# Patient Record
Sex: Female | Born: 1965 | Race: White | Hispanic: No | Marital: Married | Smoking: Never smoker
Health system: Southern US, Community
[De-identification: ages and names within clinical notes are randomized; demographics above are authoritative.]

## PROBLEM LIST (undated history)

## (undated) DIAGNOSIS — C50919 Malignant neoplasm of unspecified site of unspecified female breast: Secondary | ICD-10-CM

## (undated) DIAGNOSIS — E785 Hyperlipidemia, unspecified: Secondary | ICD-10-CM

## (undated) DIAGNOSIS — Z923 Personal history of irradiation: Secondary | ICD-10-CM

## (undated) HISTORY — PX: BREAST EXCISIONAL BIOPSY: SUR124

## (undated) HISTORY — PX: FRONTAL SINUS OBLITERATION: SHX1685

---

## 2005-05-17 ENCOUNTER — Ambulatory Visit: Payer: Self-pay | Admitting: Family Medicine

## 2005-05-28 ENCOUNTER — Ambulatory Visit: Payer: Self-pay | Admitting: Family Medicine

## 2005-06-19 ENCOUNTER — Ambulatory Visit: Payer: Self-pay | Admitting: Surgery

## 2005-07-09 ENCOUNTER — Ambulatory Visit: Payer: Self-pay | Admitting: Surgery

## 2005-08-06 ENCOUNTER — Ambulatory Visit: Payer: Self-pay | Admitting: Oncology

## 2005-08-24 ENCOUNTER — Ambulatory Visit: Payer: Self-pay | Admitting: Oncology

## 2005-09-23 ENCOUNTER — Ambulatory Visit: Payer: Self-pay | Admitting: Oncology

## 2005-10-24 ENCOUNTER — Ambulatory Visit: Payer: Self-pay | Admitting: Oncology

## 2005-12-24 DIAGNOSIS — C50919 Malignant neoplasm of unspecified site of unspecified female breast: Secondary | ICD-10-CM

## 2005-12-24 HISTORY — PX: BREAST LUMPECTOMY: SHX2

## 2005-12-24 HISTORY — DX: Malignant neoplasm of unspecified site of unspecified female breast: C50.919

## 2006-02-04 ENCOUNTER — Ambulatory Visit: Payer: Self-pay | Admitting: Oncology

## 2006-03-13 ENCOUNTER — Ambulatory Visit: Payer: Self-pay | Admitting: Oncology

## 2006-08-02 ENCOUNTER — Ambulatory Visit: Payer: Self-pay | Admitting: Oncology

## 2007-01-29 ENCOUNTER — Ambulatory Visit: Payer: Self-pay | Admitting: Oncology

## 2007-03-10 ENCOUNTER — Ambulatory Visit: Payer: Self-pay | Admitting: Oncology

## 2007-07-16 ENCOUNTER — Ambulatory Visit: Payer: Self-pay | Admitting: Otolaryngology

## 2007-08-05 ENCOUNTER — Ambulatory Visit: Payer: Self-pay | Admitting: Oncology

## 2007-08-25 ENCOUNTER — Ambulatory Visit: Payer: Self-pay | Admitting: Oncology

## 2008-01-25 ENCOUNTER — Ambulatory Visit: Payer: Self-pay | Admitting: Oncology

## 2008-02-22 ENCOUNTER — Ambulatory Visit: Payer: Self-pay | Admitting: Oncology

## 2008-03-10 ENCOUNTER — Ambulatory Visit: Payer: Self-pay | Admitting: Oncology

## 2008-08-10 ENCOUNTER — Ambulatory Visit: Payer: Self-pay | Admitting: Oncology

## 2008-08-24 ENCOUNTER — Ambulatory Visit: Payer: Self-pay | Admitting: Oncology

## 2008-11-07 ENCOUNTER — Ambulatory Visit: Payer: Self-pay | Admitting: Family Medicine

## 2008-11-12 ENCOUNTER — Inpatient Hospital Stay: Payer: Self-pay | Admitting: Internal Medicine

## 2008-12-07 ENCOUNTER — Ambulatory Visit: Payer: Self-pay | Admitting: Internal Medicine

## 2009-01-24 ENCOUNTER — Ambulatory Visit: Payer: Self-pay | Admitting: Oncology

## 2009-01-25 ENCOUNTER — Ambulatory Visit: Payer: Self-pay | Admitting: Oncology

## 2009-02-21 ENCOUNTER — Ambulatory Visit: Payer: Self-pay | Admitting: Oncology

## 2009-03-07 ENCOUNTER — Ambulatory Visit: Payer: Self-pay | Admitting: Internal Medicine

## 2009-03-11 ENCOUNTER — Ambulatory Visit: Payer: Self-pay | Admitting: Oncology

## 2009-08-24 ENCOUNTER — Ambulatory Visit: Payer: Self-pay | Admitting: Oncology

## 2009-09-20 ENCOUNTER — Ambulatory Visit: Payer: Self-pay | Admitting: Oncology

## 2009-09-23 ENCOUNTER — Ambulatory Visit: Payer: Self-pay | Admitting: Oncology

## 2010-02-21 ENCOUNTER — Ambulatory Visit: Payer: Self-pay | Admitting: Oncology

## 2010-03-07 ENCOUNTER — Ambulatory Visit: Payer: Self-pay | Admitting: Oncology

## 2010-03-24 ENCOUNTER — Ambulatory Visit: Payer: Self-pay | Admitting: Oncology

## 2010-06-06 ENCOUNTER — Ambulatory Visit: Payer: Self-pay | Admitting: Gastroenterology

## 2010-08-24 ENCOUNTER — Ambulatory Visit: Payer: Self-pay | Admitting: Oncology

## 2010-08-29 ENCOUNTER — Ambulatory Visit: Payer: Self-pay | Admitting: Oncology

## 2010-09-23 ENCOUNTER — Ambulatory Visit: Payer: Self-pay | Admitting: Oncology

## 2011-03-22 ENCOUNTER — Ambulatory Visit: Payer: Self-pay | Admitting: Oncology

## 2011-09-03 ENCOUNTER — Ambulatory Visit: Payer: Self-pay | Admitting: Oncology

## 2014-12-16 ENCOUNTER — Ambulatory Visit: Payer: Self-pay | Admitting: Emergency Medicine

## 2015-01-06 ENCOUNTER — Ambulatory Visit: Payer: Self-pay | Admitting: Family Medicine

## 2016-01-30 ENCOUNTER — Encounter: Payer: Self-pay | Admitting: Emergency Medicine

## 2016-01-30 ENCOUNTER — Emergency Department
Admission: EM | Admit: 2016-01-30 | Discharge: 2016-01-30 | Disposition: A | Discharge status: Home / Self Care | Payer: Managed Care, Other (non HMO) | Attending: Emergency Medicine | Admitting: Emergency Medicine

## 2016-01-30 DIAGNOSIS — R110 Nausea: Secondary | ICD-10-CM | POA: Diagnosis not present

## 2016-01-30 DIAGNOSIS — R42 Dizziness and giddiness: Secondary | ICD-10-CM | POA: Diagnosis present

## 2016-01-30 HISTORY — DX: Hyperlipidemia, unspecified: E78.5

## 2016-01-30 LAB — GLUCOSE, CAPILLARY: Glucose-Capillary: 100 mg/dL — ABNORMAL HIGH (ref 65–99)

## 2016-01-30 NOTE — ED Notes (Signed)
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  Pt denies hx of DM (gestational only) or hx of HTN.  Pt reports meds for high cholesterol.    Pt reports nausea, denies SOB/V/D/HA.

## 2016-07-27 ENCOUNTER — Other Ambulatory Visit: Payer: Self-pay | Admitting: Family Medicine

## 2016-07-27 DIAGNOSIS — Z1231 Encounter for screening mammogram for malignant neoplasm of breast: Secondary | ICD-10-CM

## 2018-11-17 ENCOUNTER — Other Ambulatory Visit: Payer: Self-pay | Admitting: Nurse Practitioner

## 2018-11-17 DIAGNOSIS — Z1231 Encounter for screening mammogram for malignant neoplasm of breast: Secondary | ICD-10-CM

## 2018-12-29 ENCOUNTER — Ambulatory Visit: Payer: Managed Care, Other (non HMO)

## 2019-01-07 ENCOUNTER — Ambulatory Visit
Admission: RE | Admit: 2019-01-07 | Discharge: 2019-01-07 | Disposition: A | Discharge status: Home / Self Care | Payer: BC Managed Care – PPO | Source: Ambulatory Visit | Attending: Nurse Practitioner | Admitting: Nurse Practitioner

## 2019-01-07 ENCOUNTER — Encounter: Payer: Self-pay | Admitting: Radiology

## 2019-01-07 DIAGNOSIS — Z1231 Encounter for screening mammogram for malignant neoplasm of breast: Secondary | ICD-10-CM | POA: Diagnosis not present

## 2019-01-07 HISTORY — DX: Personal history of irradiation: Z92.3

## 2019-01-07 HISTORY — DX: Malignant neoplasm of unspecified site of unspecified female breast: C50.919

## 2019-12-04 ENCOUNTER — Other Ambulatory Visit: Payer: Self-pay | Admitting: Nurse Practitioner

## 2019-12-04 DIAGNOSIS — Z1231 Encounter for screening mammogram for malignant neoplasm of breast: Secondary | ICD-10-CM

## 2020-01-12 ENCOUNTER — Ambulatory Visit
Admission: RE | Admit: 2020-01-12 | Discharge: 2020-01-12 | Disposition: A | Discharge status: Home / Self Care | Payer: BC Managed Care – PPO | Source: Ambulatory Visit | Attending: Nurse Practitioner | Admitting: Nurse Practitioner

## 2020-01-12 ENCOUNTER — Other Ambulatory Visit: Payer: Self-pay

## 2020-01-12 DIAGNOSIS — Z1231 Encounter for screening mammogram for malignant neoplasm of breast: Secondary | ICD-10-CM | POA: Insufficient documentation

## 2020-12-15 ENCOUNTER — Other Ambulatory Visit: Payer: Self-pay | Admitting: Nurse Practitioner

## 2020-12-15 DIAGNOSIS — Z1231 Encounter for screening mammogram for malignant neoplasm of breast: Secondary | ICD-10-CM

## 2021-01-12 ENCOUNTER — Other Ambulatory Visit: Payer: Self-pay

## 2021-01-12 ENCOUNTER — Ambulatory Visit
Admission: RE | Admit: 2021-01-12 | Discharge: 2021-01-12 | Disposition: A | Discharge status: Home / Self Care | Payer: BC Managed Care – PPO | Source: Ambulatory Visit | Attending: Nurse Practitioner | Admitting: Nurse Practitioner

## 2021-01-12 DIAGNOSIS — Z1231 Encounter for screening mammogram for malignant neoplasm of breast: Secondary | ICD-10-CM | POA: Insufficient documentation

## 2021-12-08 ENCOUNTER — Other Ambulatory Visit: Payer: Self-pay | Admitting: Nurse Practitioner

## 2021-12-08 DIAGNOSIS — Z1231 Encounter for screening mammogram for malignant neoplasm of breast: Secondary | ICD-10-CM

## 2022-01-04 ENCOUNTER — Other Ambulatory Visit: Payer: Self-pay

## 2022-01-04 ENCOUNTER — Ambulatory Visit
Admission: EM | Admit: 2022-01-04 | Discharge: 2022-01-04 | Disposition: A | Discharge status: Home / Self Care | Payer: BC Managed Care – PPO | Attending: Nurse Practitioner | Admitting: Nurse Practitioner

## 2022-01-04 DIAGNOSIS — B349 Viral infection, unspecified: Secondary | ICD-10-CM | POA: Diagnosis present

## 2022-01-04 DIAGNOSIS — Z1152 Encounter for screening for COVID-19: Secondary | ICD-10-CM | POA: Diagnosis present

## 2022-01-04 DIAGNOSIS — R051 Acute cough: Secondary | ICD-10-CM | POA: Diagnosis present

## 2022-01-04 LAB — RESP PANEL BY RT-PCR (FLU A&B, COVID) ARPGX2
Influenza A by PCR: NEGATIVE
Influenza B by PCR: NEGATIVE
SARS Coronavirus 2 by RT PCR: NEGATIVE

## 2022-01-04 MED ORDER — PROMETHAZINE-DM 6.25-15 MG/5ML PO SYRP: 10.0000 mL | ORAL_SOLUTION | Freq: Four times a day (QID) | ORAL | 0 refills | Status: DC | PRN

## 2022-01-04 MED ORDER — FLUTICASONE PROPIONATE 50 MCG/ACT NA SUSP: 2.0000 | Freq: Every day | NASAL | 0 refills | Status: AC

## 2022-01-04 MED ORDER — OSELTAMIVIR PHOSPHATE 75 MG PO CAPS: 75.0000 mg | ORAL_CAPSULE | Freq: Two times a day (BID) | ORAL | 0 refills | Status: DC

## 2022-01-04 NOTE — ED Triage Notes (Addendum)
Pt c/o nasal drainage, cough, sinus pressure, ear pain, headache, body aches. X1-2days

## 2022-01-04 NOTE — ED Provider Notes (Signed)
[facility]    CSN: [id number] Arrival date & time: 01/04/22  2297      History   Chief Complaint Chief Complaint  Patient presents with   Cough   Nasal Congestion    HPI Lisa Chandler is a 56 y.o. female.   Subjective:   Lisa Chandler is a 56 y.o. female who presents for evaluation of symptoms of a URI. Symptoms include low grade fevers, chills, dry cough, headache, myalgias, nasal discharge, sneezing, post nasal drip, nasal congestion, and scratchy throat. Onset of symptoms was 1 day ago and is gradually worsening since that time. She denies any nausea, vomiting, diarrhea. She is drinking plenty of fluids and has tried Copywriter, advertising cold/flu for her symptoms. She denies any sick contacts. 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 She denies any history of COVID. She has been vaccinated against COVID with 1 booster.   The following portions of the patient's history were reviewed and updated as appropriate: allergies, current medications, past family history, past medical history, past social history, past surgical history, and problem list.   Past Medical History:  Diagnosis Date   Breast cancer ([place]) 2007   lt/radiation   Hyperlipemia    Personal history of radiation therapy     There are no problems to display for this patient.   Past Surgical History:  Procedure Laterality Date   BREAST LUMPECTOMY Left 2007   positive/ radiation   FRONTAL SINUS OBLITERATION Right     OB History   No obstetric history on file.      Home Medications    Prior to Admission medications   Medication Sig Start Date End Date Taking? Authorizing Provider  cyclobenzaprine (FLEXERIL) 5 MG tablet Take 5 mg by mouth at bedtime as needed. 10/13/21  Yes [provider]  fluticasone (FLONASE) 50 MCG/ACT nasal spray Place 2 sprays into both nostrils daily. 01/04/22  Yes Enrique Sack, FNP  oseltamivir  (TAMIFLU) 75 MG capsule Take 1 capsule (75 mg total) by mouth every 12 (twelve) hours. 01/04/22  Yes Enrique Sack, FNP  PARoxetine (PAXIL) 20 MG tablet Take 20 mg by mouth daily. 11/12/21  Yes [provider]  promethazine-dextromethorphan (PROMETHAZINE-DM) 6.25-15 MG/5ML syrup Take 10 mLs by mouth every 6 (six) hours as needed for cough. 01/04/22  Yes Praneeth Bussey, Aldona Bar, FNP  senna-docusate (SENOKOT-S) 8.6-50 MG tablet Take 1 tablet by mouth 2 (two) times daily.   Yes [provider]  simvastatin (ZOCOR) 20 MG tablet TAKE 1 TABLET BY MOUTH EVERY DAY AT NIGHT 08/04/21  Yes [provider]    Family History Family History  Problem Relation Age of Onset   Breast cancer Neg Hx     Social History Social History   Tobacco Use   Smoking status: Never   Smokeless tobacco: Never  Substance Use Topics   Alcohol use: No   Drug use: No     Allergies   Patient has no known allergies.   Review of Systems Review of Systems  Constitutional:  Positive for chills, fatigue and fever.  HENT:  Positive for congestion, postnasal drip, rhinorrhea and sneezing. Negative for sore throat.   Respiratory:  Positive for cough. Negative for shortness of breath and wheezing.   Gastrointestinal:  Negative for diarrhea, nausea and vomiting.  Musculoskeletal:  Positive for myalgias.  Neurological:  Positive for headaches.  All other systems reviewed and are negative.   Physical Exam Triage Vital Signs  ED Triage Vitals  Enc Vitals Group     BP 01/04/22 0843 137/87     Pulse Rate 01/04/22 0843 (!) 112     Resp 01/04/22 0843 18     Temp 01/04/22 0843 98.6 F (37 C)     Temp Source 01/04/22 0843 Oral     SpO2 01/04/22 0843 96 %     Weight 01/04/22 0841 185 lb (83.9 kg)     Height 01/04/22 0841 5\' 4"  (1.626 m)     Head Circumference --      Peak Flow --      Pain Score 01/04/22 0841 5     Pain Loc --      Pain Edu? --      Excl. in [place]? --    No data found.  Updated  Vital Signs BP 137/87 (BP Location: Left Arm)    Pulse (!) 112    Temp 98.6 F (37 C) (Oral)    Resp 18    Ht 5\' 4"  (1.626 m)    Wt 185 lb (83.9 kg)    LMP 01/11/2016    SpO2 96%    BMI 31.76 kg/m   Visual Acuity Right Eye Distance:   Left Eye Distance:   Bilateral Distance:    Right Eye Near:   Left Eye Near:    Bilateral Near:     Physical Exam Vitals reviewed.  Constitutional:      General: She is not in acute distress.    Appearance: Normal appearance. She is ill-appearing. She is not toxic-appearing or diaphoretic.  HENT:     Head: Normocephalic.     Right Ear: Tympanic membrane, ear canal and external ear normal.     Left Ear: Tympanic membrane, ear canal and external ear normal.     Nose: Congestion present.     Mouth/Throat:     Mouth: Mucous membranes are moist.  Eyes:     Extraocular Movements: Extraocular movements intact.     Conjunctiva/sclera: Conjunctivae normal.     Pupils: Pupils are equal, round, and reactive to light.  Cardiovascular:     Rate and Rhythm: Normal rate.  Pulmonary:     Effort: Pulmonary effort is normal.  Abdominal:     Palpations: Abdomen is soft.  Musculoskeletal:        General: Normal range of motion.     Cervical back: Normal range of motion and neck supple.  Lymphadenopathy:     Cervical: No cervical adenopathy.  Skin:    General: Skin is warm and dry.  Neurological:     General: No focal deficit present.     Mental Status: She is alert and oriented to person, place, and time.  Psychiatric:        Mood and Affect: Mood normal.        Behavior: Behavior normal.     UC Treatments / Results  Labs (all labs ordered are listed, but only abnormal results are displayed) Labs Reviewed  RESP PANEL BY RT-PCR (FLU A&B, COVID) [id number]    EKG   Radiology No results found.  Procedures Procedures (including critical care time)  Medications Ordered in UC Medications - No data to display  Initial Impression / Assessment and  Plan / UC Course  I have reviewed the triage vital signs and the nursing notes.  Pertinent labs & imaging results that were available during my care of the patient were reviewed by me and considered in my medical decision making (see  chart for details).     56 year old female presenting with low-grade fevers, chills, cough, headache, myalgias, runny nose, sneezing, congestion and scratchy throat for the past day or so.  Symptoms are gradually worsening.  Patient is acutely ill-appearing but nontoxic.  She is afebrile.  Physical exam unremarkable.  COVID/flu test pending.  Tamiflu, Flonase, Promethazine DM prescribed.  Advised to drink plenty of fluids and rest.  Follow-up as needed.  Today's evaluation has revealed no signs of a dangerous process. Discussed diagnosis with patient and/or guardian. Patient and/or guardian aware of their diagnosis, possible red flag symptoms to watch out for and need for close follow up. Patient and/or guardian understands verbal and written discharge instructions. Patient and/or guardian comfortable with plan and disposition.  Patient and/or guardian has a clear mental status at this time, good insight into illness (after discussion and teaching) and has clear judgment to make decisions regarding their care  This care was provided during an unprecedented National Emergency due to the Novel Coronavirus (COVID-19) pandemic. COVID-19 infections and transmission risks place heavy strains on healthcare resources.  As this pandemic evolves, our facility, providers, and staff strive to respond fluidly, to remain operational, and to provide care relative to available resources and information. Outcomes are unpredictable and treatments are without well-defined guidelines. Further, the impact of COVID-19 on all aspects of urgent care, including the impact to patients seeking care for reasons other than COVID-19, is unavoidable during this national emergency. At this time of the  global pandemic, management of patients has significantly changed, even for non-COVID positive patients given high local and regional COVID volumes at this time requiring high healthcare system and resource utilization. The standard of care for management of both COVID suspected and non-COVID suspected patients continues to change rapidly at the local, regional, national, and global levels. This patient was worked up and treated to the best available but ever changing evidence and resources available at this current time.   Documentation was completed with the aid of voice recognition software. Transcription may contain typographical errors.  Final Clinical Impressions(s) / UC Diagnoses   Final diagnoses:  Viral illness  Encounter for screening for COVID-19  Acute cough     Discharge Instructions      Take medications as prescribed. You may take tylenol or ibuprofen as needed for fevers/headache/body aches. Drink plenty of fluids. Stay in home isolation until you receive results of your COVID test. You will only be notified for positive results. You may go online to [place] and review your results. Go to the ED immediately if you get worse or have any other symptoms.  Feel better soon!  Aldona Bar, FNP-C       ED Prescriptions     Medication Sig Dispense Auth. Provider   oseltamivir (TAMIFLU) 75 MG capsule Take 1 capsule (75 mg total) by mouth every 12 (twelve) hours. 10 capsule Enrique Sack, FNP   fluticasone (FLONASE) 50 MCG/ACT nasal spray Place 2 sprays into both nostrils daily. 16 g Enrique Sack, FNP   promethazine-dextromethorphan (PROMETHAZINE-DM) 6.25-15 MG/5ML syrup Take 10 mLs by mouth every 6 (six) hours as needed for cough. 118 mL Enrique Sack, FNP      PDMP not reviewed this encounter.   Orlin Hilding Anderson, [place] 01/04/22 [phone]

## 2022-01-04 NOTE — Discharge Instructions (Addendum)
Take medications as prescribed. You may take tylenol or ibuprofen as needed for fevers/headache/body aches. Drink plenty of fluids. Stay in home isolation until you receive results of your COVID test. You will only be notified for positive results. You may go online to MyChart and review your results. Go to the ED immediately if you get worse or have any other symptoms.  Feel better soon!  Ericca Labra, FNP-C   

## 2022-01-18 ENCOUNTER — Other Ambulatory Visit: Payer: Self-pay

## 2022-01-18 ENCOUNTER — Ambulatory Visit
Admission: RE | Admit: 2022-01-18 | Discharge: 2022-01-18 | Disposition: A | Discharge status: Home / Self Care | Payer: BC Managed Care – PPO | Source: Ambulatory Visit | Attending: Nurse Practitioner | Admitting: Nurse Practitioner

## 2022-01-18 DIAGNOSIS — Z1231 Encounter for screening mammogram for malignant neoplasm of breast: Secondary | ICD-10-CM | POA: Diagnosis present

## 2022-09-12 IMAGING — MG MM DIGITAL SCREENING BILAT W/ TOMO AND CAD
8 series · 8 of 24 positions shown · non-contrast
Comparison: Previous exam(s).

CLINICAL DATA: Screening.

EXAM:
DIGITAL SCREENING BILATERAL MAMMOGRAM WITH TOMO AND CAD

[R MLO synth-2D]
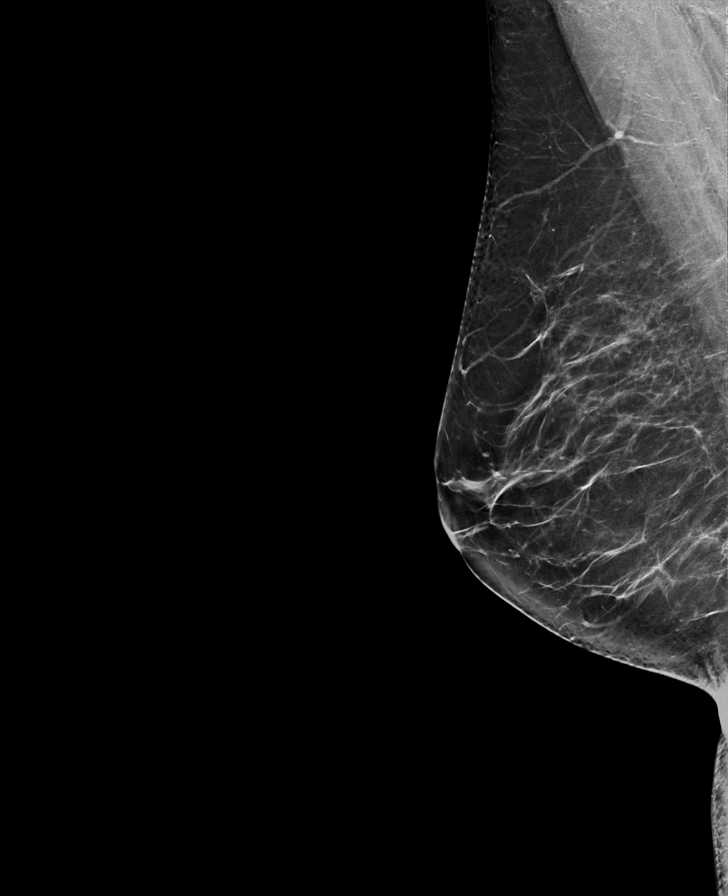

[R CC synth-2D]
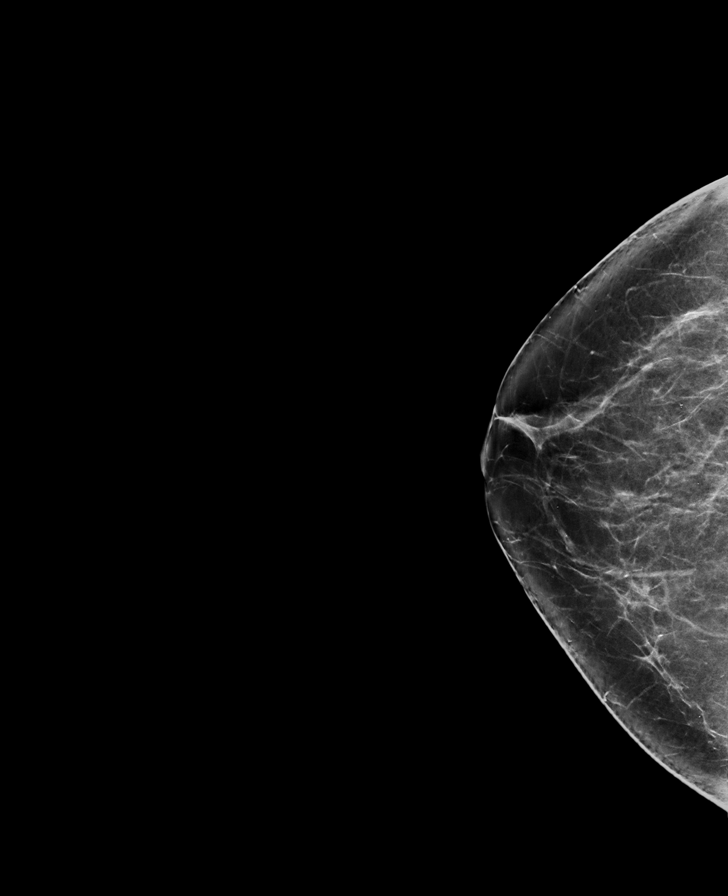

[L MLO synth-2D]
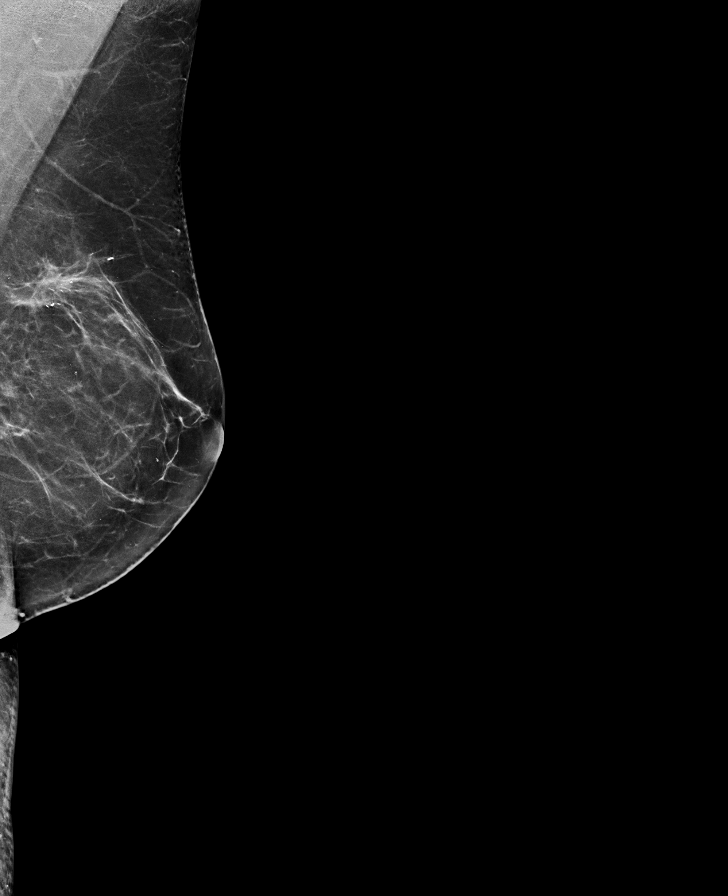

[L CC synth-2D]
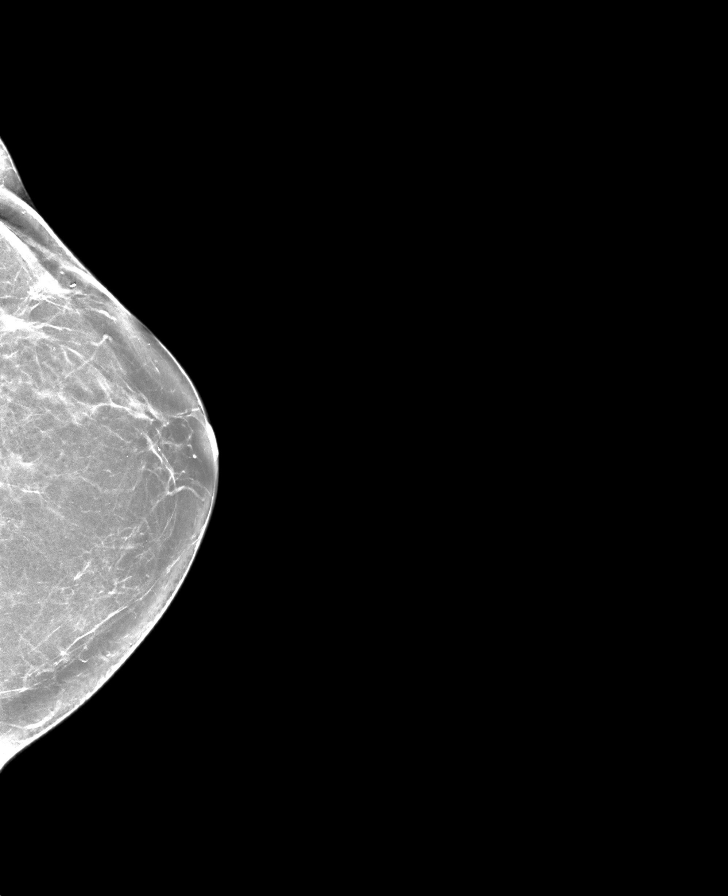

[R MLO tomo · tomo slice 39/77.0]
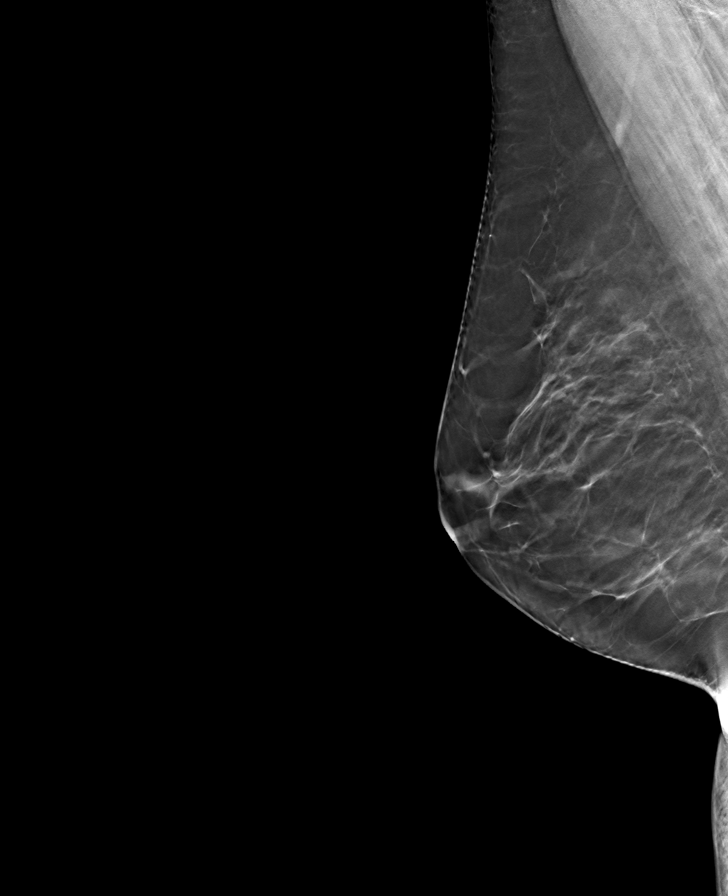

[L CC tomo · tomo slice 35/68.0]
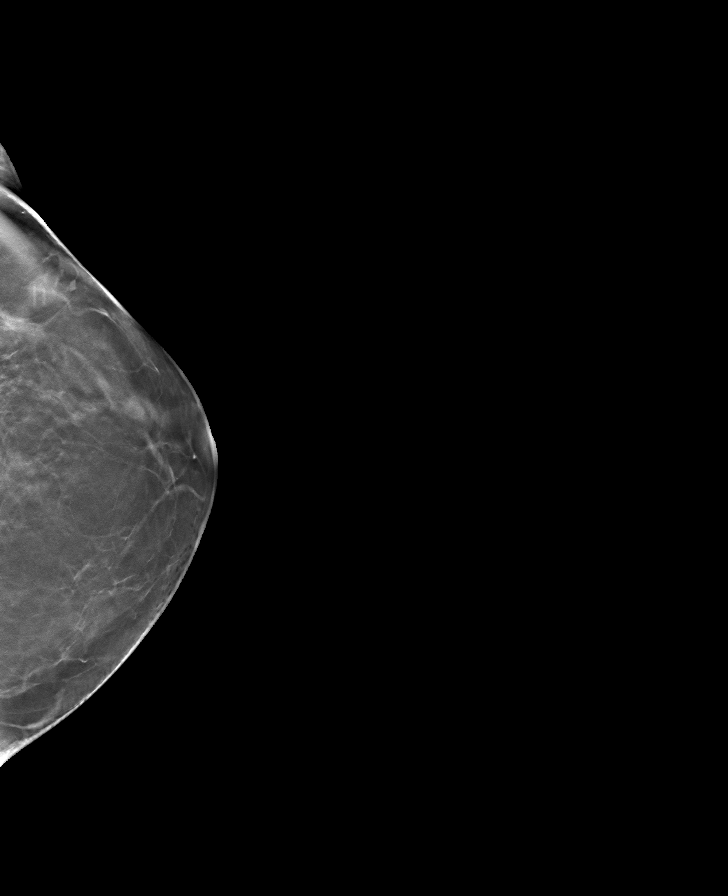

[L MLO tomo · tomo slice 33/66.0]
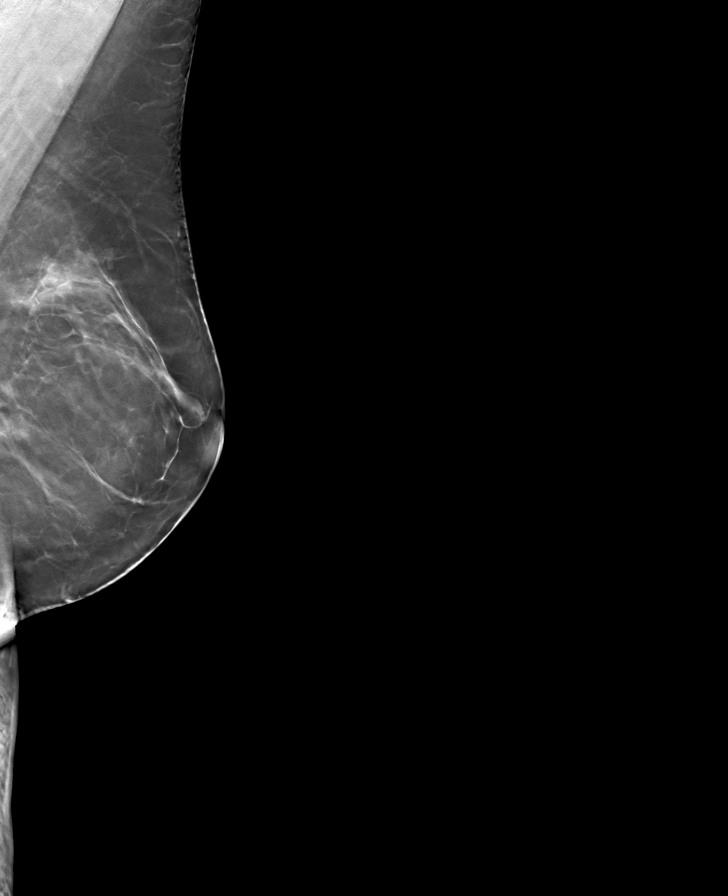

[R CC tomo · tomo slice 38/75.0]
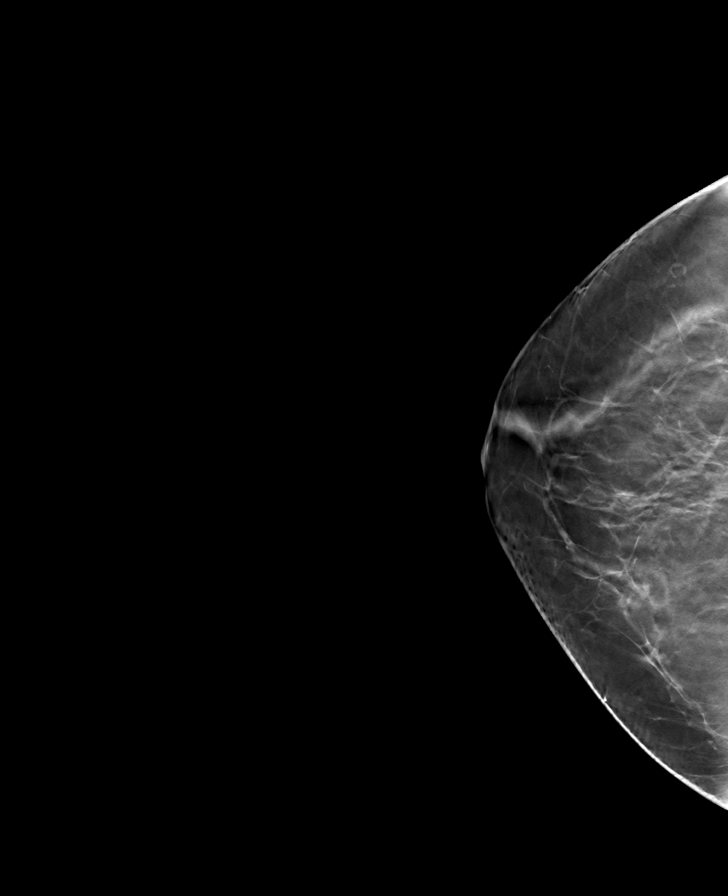

[8 of 24 positions shown; findings below may reference images not displayed]

ACR Breast Density Category b: There are scattered areas of
fibroglandular density.
FINDINGS: There are no findings suspicious for malignancy. The images were
evaluated with computer-aided detection.
IMPRESSION: No mammographic evidence of malignancy. A result letter of this
screening mammogram will be mailed directly to the patient.

RECOMMENDATION:
Screening mammogram in one year. (Code:ZP-7-VX7)

BI-RADS CATEGORY  1: Negative.

## 2022-12-21 ENCOUNTER — Other Ambulatory Visit: Payer: Self-pay | Admitting: Nurse Practitioner

## 2022-12-21 DIAGNOSIS — Z1231 Encounter for screening mammogram for malignant neoplasm of breast: Secondary | ICD-10-CM

## 2023-01-28 ENCOUNTER — Ambulatory Visit
Admission: RE | Admit: 2023-01-28 | Discharge: 2023-01-28 | Disposition: A | Discharge status: Home / Self Care | Payer: BC Managed Care – PPO | Source: Ambulatory Visit | Attending: Nurse Practitioner | Admitting: Nurse Practitioner

## 2023-01-28 DIAGNOSIS — Z1231 Encounter for screening mammogram for malignant neoplasm of breast: Secondary | ICD-10-CM | POA: Insufficient documentation

## 2023-02-08 ENCOUNTER — Encounter: Payer: Self-pay | Admitting: Emergency Medicine

## 2023-02-08 ENCOUNTER — Ambulatory Visit
Admission: EM | Admit: 2023-02-08 | Discharge: 2023-02-08 | Disposition: A | Discharge status: Home / Self Care | Payer: BC Managed Care – PPO | Attending: Physician Assistant | Admitting: Physician Assistant

## 2023-02-08 DIAGNOSIS — R0982 Postnasal drip: Secondary | ICD-10-CM | POA: Insufficient documentation

## 2023-02-08 DIAGNOSIS — J029 Acute pharyngitis, unspecified: Secondary | ICD-10-CM | POA: Insufficient documentation

## 2023-02-08 DIAGNOSIS — J019 Acute sinusitis, unspecified: Secondary | ICD-10-CM | POA: Insufficient documentation

## 2023-02-08 LAB — GROUP A STREP BY PCR: Group A Strep by PCR: NOT DETECTED

## 2023-02-08 MED ORDER — PROMETHAZINE-DM 6.25-15 MG/5ML PO SYRP: 5.0000 mL | ORAL_SOLUTION | Freq: Four times a day (QID) | ORAL | 0 refills | Status: DC | PRN

## 2023-02-08 MED ORDER — AMOXICILLIN-POT CLAVULANATE 875-125 MG PO TABS: 1.0000 | ORAL_TABLET | Freq: Two times a day (BID) | ORAL | 0 refills | Status: AC

## 2023-02-08 MED ORDER — LIDOCAINE VISCOUS HCL 2 % MT SOLN: 15.0000 mL | OROMUCOSAL | 0 refills | Status: AC | PRN

## 2023-02-08 NOTE — ED Triage Notes (Signed)
Patient c/o sinus drainage and sore throat for a week.  Patient denies fevers.

## 2023-02-08 NOTE — ED Provider Notes (Signed)
[facility]    CSN: [id number] Arrival date & time: 02/08/23  1826      History   Chief Complaint Chief Complaint  Patient presents with   Sore Throat    HPI Lisa Chandler is a 57 y.o. female presenting for 1 day history of sore throat, postnasal drainage, sinus pressure.  Reports over the past couple days she has had yellowish-green nasal drainage.  Denies fever.  Has had a cough.  No chest pain, shortness of breath.  No known exposure to flu or COVID.  Has been taking over-the-counter Alka-Seltzer and other decongestants without relief.  Recent worsening of symptoms.  No other complaints.  HPI  Past Medical History:  Diagnosis Date   Breast cancer ([place]) 2007   lt/radiation   Hyperlipemia    Personal history of radiation therapy     There are no problems to display for this patient.   Past Surgical History:  Procedure Laterality Date   BREAST EXCISIONAL BIOPSY Left    BREAST LUMPECTOMY Left 2007   positive/ radiation   FRONTAL SINUS OBLITERATION Right     OB History   No obstetric history on file.      Home Medications    Prior to Admission medications   Medication Sig Start Date End Date Taking? Authorizing Provider  amoxicillin-clavulanate (AUGMENTIN) 875-125 MG tablet Take 1 tablet by mouth every 12 (twelve) hours for 7 days. 02/08/23 02/15/23 Yes Laurene Footman B, PA-C  lidocaine (XYLOCAINE) 2 % solution Use as directed 15 mLs in the mouth or throat every 3 (three) hours as needed for mouth pain (swish and spit). 02/08/23  Yes Danton Clap, PA-C  PARoxetine (PAXIL) 20 MG tablet Take 20 mg by mouth daily. 11/12/21  Yes [provider]  promethazine-dextromethorphan (PROMETHAZINE-DM) 6.25-15 MG/5ML syrup Take 5 mLs by mouth 4 (four) times daily as needed. 02/08/23  Yes Laurene Footman B, PA-C  simvastatin (ZOCOR) 20 MG tablet TAKE 1 TABLET BY MOUTH EVERY DAY AT NIGHT 08/04/21  Yes [provider]  cyclobenzaprine (FLEXERIL) 5  MG tablet Take 5 mg by mouth at bedtime as needed. 10/13/21   [provider]  fluticasone (FLONASE) 50 MCG/ACT nasal spray Place 2 sprays into both nostrils daily. 01/04/22   Enrique Sack, FNP  senna-docusate (SENOKOT-S) 8.6-50 MG tablet Take 1 tablet by mouth 2 (two) times daily.    [provider]    Family History Family History  Problem Relation Age of Onset   Breast cancer Neg Hx     Social History Social History   Tobacco Use   Smoking status: Never   Smokeless tobacco: Never  Vaping Use   Vaping Use: Never used  Substance Use Topics   Alcohol use: No   Drug use: No     Allergies   Patient has no known allergies.   Review of Systems Review of Systems  Constitutional:  Negative for chills, diaphoresis, fatigue and fever.  HENT:  Positive for sore throat. Negative for congestion, ear pain, rhinorrhea, sinus pressure and sinus pain.   Respiratory:  Negative for cough and shortness of breath.   Gastrointestinal:  Negative for abdominal pain, nausea and vomiting.  Musculoskeletal:  Negative for arthralgias and myalgias.  Skin:  Negative for rash.  Neurological:  Negative for weakness and headaches.  Hematological:  Negative for adenopathy.     Physical Exam Triage Vital Signs ED Triage Vitals  Enc Vitals Group     BP  Pulse      Resp      Temp      Temp src      SpO2      Weight      Height      Head Circumference      Peak Flow      Pain Score      Pain Loc      Pain Edu?      Excl. in [place]?    No data found.  Updated Vital Signs BP 130/82 (BP Location: Right Arm)   Pulse 70   Temp 98.7 F (37.1 C) (Oral)   Resp 14   Ht 5' 4"$  (1.626 m)   Wt 184 lb 15.5 oz (83.9 kg)   LMP 01/11/2016   SpO2 95%   BMI 31.75 kg/m    Physical Exam Vitals and nursing note reviewed.  Constitutional:      General: She is not in acute distress.    Appearance: Normal appearance. She is ill-appearing. She is not toxic-appearing.  HENT:      Head: Normocephalic and atraumatic.     Right Ear: Tympanic membrane, ear canal and external ear normal.     Left Ear: Tympanic membrane, ear canal and external ear normal.     Nose: Congestion present.     Mouth/Throat:     Mouth: Mucous membranes are moist.     Pharynx: Oropharynx is clear. Posterior oropharyngeal erythema present.  Eyes:     General: No scleral icterus.       Right eye: No discharge.        Left eye: No discharge.     Conjunctiva/sclera: Conjunctivae normal.  Cardiovascular:     Rate and Rhythm: Normal rate and regular rhythm.     Heart sounds: Normal heart sounds.  Pulmonary:     Effort: Pulmonary effort is normal. No respiratory distress.     Breath sounds: Normal breath sounds.  Musculoskeletal:     Cervical back: Neck supple.  Skin:    General: Skin is dry.  Neurological:     General: No focal deficit present.     Mental Status: She is alert. Mental status is at baseline.     Motor: No weakness.     Gait: Gait normal.  Psychiatric:        Mood and Affect: Mood normal.        Behavior: Behavior normal.        Thought Content: Thought content normal.      UC Treatments / Results  Labs (all labs ordered are listed, but only abnormal results are displayed) Labs Reviewed  GROUP A STREP BY PCR    EKG   Radiology No results found.  Procedures Procedures (including critical care time)  Medications Ordered in UC Medications - No data to display  Initial Impression / Assessment and Plan / UC Course  I have reviewed the triage vital signs and the nursing notes.  Pertinent labs & imaging results that were available during my care of the patient were reviewed by me and considered in my medical decision making (see chart for details).   56 year old female presents for 9-day history of sore throat, postnasal drainage, sinus pressure, nasal congestion and cough.  Recent worsening of symptoms and nasal drainage has become discolored.  History of  sinus problems and has had previous sinus surgery.  Vitals normal and stable and she is overall well-appearing.  Exam shows nasal congestion, erythema posterior pharynx with  postnasal drainage.  She is ill-appearing but she is nontoxic.  Chest is clear to auscultation.  PCR strep negative.  Advised patient symptoms consistent with acute bacterial sinusitis.  Treating this time with Augmentin.  Also encouraged her to use Flonase. Sent promethazine DM. Increase rest and fluids.  Sent viscous lidocaine as well.  Reviewed return to ED precautions.   Final Clinical Impressions(s) / UC Diagnoses   Final diagnoses:  Acute sinusitis, recurrence not specified, unspecified location  Sore throat  Post-nasal drainage     Discharge Instructions      -Negative strep. - You have a sinus infection.  Sent antibiotics to the pharmacy as well as cough medicine viscous lidocaine.  You could also use over-the-counter Flonase, rest and fluids, throat lozenges, Chloraseptic spray.  May also take ibuprofen and Tylenol as needed for pain relief. - You should be feeling better over the next week.     ED Prescriptions     Medication Sig Dispense Auth. Provider   promethazine-dextromethorphan (PROMETHAZINE-DM) 6.25-15 MG/5ML syrup Take 5 mLs by mouth 4 (four) times daily as needed. 118 mL Laurene Footman B, PA-C   lidocaine (XYLOCAINE) 2 % solution Use as directed 15 mLs in the mouth or throat every 3 (three) hours as needed for mouth pain (swish and spit). 100 mL Laurene Footman B, PA-C   amoxicillin-clavulanate (AUGMENTIN) 875-125 MG tablet Take 1 tablet by mouth every 12 (twelve) hours for 7 days. 14 tablet Gretta Cool      PDMP not reviewed this encounter.   Danton Clap, PA-C 02/08/23 1919

## 2023-02-08 NOTE — Discharge Instructions (Signed)
-  Negative strep. - You have a sinus infection.  Sent antibiotics to the pharmacy as well as cough medicine viscous lidocaine.  You could also use over-the-counter Flonase, rest and fluids, throat lozenges, Chloraseptic spray.  May also take ibuprofen and Tylenol as needed for pain relief. - You should be feeling better over the next week.

## 2024-01-02 ENCOUNTER — Other Ambulatory Visit: Payer: Self-pay | Admitting: Nurse Practitioner

## 2024-01-02 DIAGNOSIS — Z1231 Encounter for screening mammogram for malignant neoplasm of breast: Secondary | ICD-10-CM

## 2024-02-03 ENCOUNTER — Ambulatory Visit
Admission: RE | Admit: 2024-02-03 | Discharge: 2024-02-03 | Disposition: A | Discharge status: Home / Self Care | Payer: 59 | Source: Ambulatory Visit | Attending: Nurse Practitioner | Admitting: Nurse Practitioner

## 2024-02-03 ENCOUNTER — Ambulatory Visit: Payer: Self-pay

## 2024-02-03 DIAGNOSIS — Z1231 Encounter for screening mammogram for malignant neoplasm of breast: Secondary | ICD-10-CM | POA: Diagnosis present

## 2024-11-13 ENCOUNTER — Ambulatory Visit
Admission: EM | Admit: 2024-11-13 | Discharge: 2024-11-13 | Disposition: A | Discharge status: Home / Self Care | Attending: Emergency Medicine | Admitting: Emergency Medicine

## 2024-11-13 ENCOUNTER — Encounter: Payer: Self-pay | Admitting: Emergency Medicine

## 2024-11-13 DIAGNOSIS — J069 Acute upper respiratory infection, unspecified: Secondary | ICD-10-CM

## 2024-11-13 DIAGNOSIS — R051 Acute cough: Secondary | ICD-10-CM | POA: Diagnosis not present

## 2024-11-13 DIAGNOSIS — J040 Acute laryngitis: Secondary | ICD-10-CM

## 2024-11-13 LAB — POC COVID19/FLU A&B COMBO
Covid Antigen, POC: NEGATIVE
Influenza A Antigen, POC: NEGATIVE
Influenza B Antigen, POC: NEGATIVE

## 2024-11-13 MED ORDER — PROMETHAZINE-DM 6.25-15 MG/5ML PO SYRP: 5.0000 mL | ORAL_SOLUTION | Freq: Four times a day (QID) | ORAL | 0 refills | Status: AC | PRN

## 2024-11-13 NOTE — ED Triage Notes (Signed)
 Patient c/o cough, head congestion, sinus drainage, loss of voice that started on Wed.  Patient states that she tried to go to work today, but started to feel worse.  Patient denies fevers.

## 2024-11-13 NOTE — ED Provider Notes (Signed)
 [facility]    CSN: [id number] Arrival date & time: 11/13/24  1511      History   Chief Complaint Chief Complaint  Patient presents with   Cough    HPI Lisa Chandler is a 58 y.o. female.   58 year old female, Lisa Chandler, presents to urgent care for evaluation of cough head congestion sinus drainage loss of voice that started Wednesday(2 days).  Patient states she tried to get a work today but started feel worse.  Patient denies any fever. Pt reports husband recently sick as well pt had a colonoscopy 2 days prior.  The history is provided by the patient. No language interpreter was used.    Past Medical History:  Diagnosis Date   Breast cancer (HCC) 2007   lt/radiation   Hyperlipemia    Personal history of radiation therapy     Patient Active Problem List   Diagnosis Date Noted   Laryngitis 11/13/2024   Acute cough 11/13/2024   Viral URI 11/13/2024    Past Surgical History:  Procedure Laterality Date   BREAST EXCISIONAL BIOPSY Left    BREAST LUMPECTOMY Left 2007   positive/ radiation   FRONTAL SINUS OBLITERATION Right     OB History   No obstetric history on file.      Home Medications    Prior to Admission medications   Medication Sig Start Date End Date Taking? Authorizing Provider  cyclobenzaprine (FLEXERIL) 5 MG tablet Take 5 mg by mouth at bedtime as needed. 10/13/21   [provider]  fluticasone  (FLONASE ) 50 MCG/ACT nasal spray Place 2 sprays into both nostrils daily. 01/04/22   Iola Lukes, FNP  lidocaine  (XYLOCAINE ) 2 % solution Use as directed 15 mLs in the mouth or throat every 3 (three) hours as needed for mouth pain (swish and spit). 02/08/23   Arvis Jolan NOVAK, PA-C  PARoxetine (PAXIL) 20 MG tablet Take 20 mg by mouth daily. 11/12/21   [provider]  promethazine -dextromethorphan (PROMETHAZINE -DM) 6.25-15 MG/5ML syrup Take 5 mLs by mouth 4 (four) times daily as needed. 11/13/24   Faatima Tench,  Torey Reinard, NP  senna-docusate (SENOKOT-S) 8.6-50 MG tablet Take 1 tablet by mouth 2 (two) times daily.    [provider]  simvastatin (ZOCOR) 20 MG tablet TAKE 1 TABLET BY MOUTH EVERY DAY AT NIGHT 08/04/21   [provider]    Family History Family History  Problem Relation Age of Onset   Breast cancer Neg Hx     Social History Social History   Tobacco Use   Smoking status: Never   Smokeless tobacco: Never  Vaping Use   Vaping status: Never Used  Substance Use Topics   Alcohol use: No   Drug use: No     Allergies   Patient has no known allergies.   Review of Systems Review of Systems  Constitutional:  Negative for fever.  HENT:  Positive for congestion, postnasal drip and voice change.   Respiratory:  Positive for cough.   All other systems reviewed and are negative.    Physical Exam Triage Vital Signs ED Triage Vitals  Encounter Vitals Group     BP 11/13/24 1530 129/85     Girls Systolic BP Percentile --      Girls Diastolic BP Percentile --      Boys Systolic BP Percentile --      Boys Diastolic BP Percentile --      Pulse Rate 11/13/24 1530 (!) 56  Resp 11/13/24 1530 14     Temp 11/13/24 1530 97.6 F (36.4 C)     Temp Source 11/13/24 1530 Oral     SpO2 11/13/24 1530 97 %     Weight 11/13/24 1529 184 lb 15.5 oz (83.9 kg)     Height 11/13/24 1529 5' 4 (1.626 m)     Head Circumference --      Peak Flow --      Pain Score 11/13/24 1529 2     Pain Loc --      Pain Education --      Exclude from Growth Chart --    No data found.  Updated Vital Signs BP 129/85 (BP Location: Right Arm)   Pulse (!) 56   Temp 97.6 F (36.4 C) (Oral)   Resp 14   Ht 5' 4 (1.626 m)   Wt 184 lb 15.5 oz (83.9 kg)   LMP 01/11/2016   SpO2 97%   BMI 31.75 kg/m   Visual Acuity Right Eye Distance:   Left Eye Distance:   Bilateral Distance:    Right Eye Near:   Left Eye Near:    Bilateral Near:     Physical Exam Vitals and nursing note  reviewed.  Constitutional:      General: She is not in acute distress.    Appearance: She is well-developed and well-groomed.  HENT:     Head: Normocephalic and atraumatic.     Right Ear: Tympanic membrane is retracted.     Left Ear: Tympanic membrane is retracted.     Nose: Mucosal edema and congestion present.     Mouth/Throat:     Lips: Pink.     Mouth: Mucous membranes are moist.     Pharynx: Oropharynx is clear. Uvula midline.  Eyes:     Conjunctiva/sclera: Conjunctivae normal.  Cardiovascular:     Rate and Rhythm: Normal rate and regular rhythm.     Heart sounds: Normal heart sounds. No murmur heard. Pulmonary:     Effort: Pulmonary effort is normal. No respiratory distress.     Breath sounds: Normal breath sounds and air entry.  Abdominal:     Palpations: Abdomen is soft.     Tenderness: There is no abdominal tenderness.  Musculoskeletal:        General: No swelling.     Cervical back: Neck supple.  Skin:    General: Skin is warm and dry.     Capillary Refill: Capillary refill takes less than 2 seconds.  Neurological:     General: No focal deficit present.     Mental Status: She is alert and oriented to person, place, and time.     GCS: GCS eye subscore is 4. GCS verbal subscore is 5. GCS motor subscore is 6.  Psychiatric:        Attention and Perception: Attention normal.        Mood and Affect: Mood normal.        Speech: Speech normal.        Behavior: Behavior normal. Behavior is cooperative.      UC Treatments / Results  Labs (all labs ordered are listed, but only abnormal results are displayed) Labs Reviewed  POC COVID19/FLU A&B COMBO - Normal    EKG   Radiology No results found.  Procedures Procedures (including critical care time)  Medications Ordered in UC Medications - No data to display  Initial Impression / Assessment and Plan / UC Course  I have reviewed the  triage vital signs and the nursing notes.  Pertinent labs & imaging results  that were available during my care of the patient were reviewed by me and considered in my medical decision making (see chart for details).  Clinical Course as of 11/13/24 1936  Fri Nov 13, 2024  1612 Flu/covid negative, will script promethazine  Dm cough med [JD]    Clinical Course User Index [JD] Delta Pichon, Rilla, NP   Discussed exam findings and plan of care with patient, Phenergan  DM cough medicine scripted, strict go to ER precautions given.   Patient verbalized understanding to this provider.  Ddx: Viral URI w cough,laryngitis, allergies Final Clinical Impressions(s) / UC Diagnoses   Final diagnoses:  Acute cough  Laryngitis  Viral URI     Discharge Instructions      Your covid and flu are negative. Most likely you have a viral illness: no antibiotic is indicated at this time, May treat with OTC meds of choice. Take cough med as directed(drowsiness precautions. Make sure to drink plenty of fluids to stay hydrated(gatorade, water, popsicles,jello,etc), avoid caffeine products. Follow up with PCP. Return as needed.     ED Prescriptions     Medication Sig Dispense Auth. Provider   promethazine -dextromethorphan (PROMETHAZINE -DM) 6.25-15 MG/5ML syrup Take 5 mLs by mouth 4 (four) times daily as needed. 118 mL Soni Kegel, NP      PDMP not reviewed this encounter.   Aminta Rilla, NP 11/13/24 [phone]

## 2024-11-13 NOTE — Discharge Instructions (Signed)
 Your covid and flu are negative. Most likely you have a viral illness: no antibiotic is indicated at this time, May treat with OTC meds of choice. Take cough med as directed(drowsiness precautions. Make sure to drink plenty of fluids to stay hydrated(gatorade, water, popsicles,jello,etc), avoid caffeine products. Follow up with PCP. Return as needed.

## 2025-01-08 ENCOUNTER — Other Ambulatory Visit: Payer: Self-pay | Admitting: Nurse Practitioner

## 2025-01-08 DIAGNOSIS — Z1231 Encounter for screening mammogram for malignant neoplasm of breast: Secondary | ICD-10-CM

## 2025-02-10 ENCOUNTER — Ambulatory Visit
